# Patient Record
Sex: Female | Born: 1970 | Race: White | Hispanic: Yes | Marital: Single | Smoking: Never smoker
Health system: Southern US, Community
[De-identification: ages and names within clinical notes are randomized; demographics above are authoritative.]

---

## 1998-04-10 ENCOUNTER — Emergency Department (HOSPITAL_COMMUNITY): Admission: EM | Admit: 1998-04-10 | Discharge: 1998-04-10 | Payer: Self-pay | Admitting: Emergency Medicine

## 2005-07-15 ENCOUNTER — Ambulatory Visit: Payer: Self-pay | Admitting: Obstetrics and Gynecology

## 2006-04-23 ENCOUNTER — Emergency Department (HOSPITAL_COMMUNITY): Admission: EM | Admit: 2006-04-23 | Discharge: 2006-04-23 | Payer: Self-pay | Admitting: Emergency Medicine

## 2012-07-20 ENCOUNTER — Emergency Department (HOSPITAL_COMMUNITY): Payer: Self-pay

## 2012-07-20 ENCOUNTER — Emergency Department (HOSPITAL_COMMUNITY)
Admission: EM | Admit: 2012-07-20 | Discharge: 2012-07-21 | Disposition: A | Discharge status: Home / Self Care | Payer: Self-pay | Attending: Emergency Medicine | Admitting: Emergency Medicine

## 2012-07-20 ENCOUNTER — Encounter (HOSPITAL_COMMUNITY): Payer: Self-pay | Admitting: Emergency Medicine

## 2012-07-20 DIAGNOSIS — Z79899 Other long term (current) drug therapy: Secondary | ICD-10-CM | POA: Insufficient documentation

## 2012-07-20 DIAGNOSIS — M549 Dorsalgia, unspecified: Secondary | ICD-10-CM | POA: Insufficient documentation

## 2012-07-20 DIAGNOSIS — R071 Chest pain on breathing: Secondary | ICD-10-CM | POA: Insufficient documentation

## 2012-07-20 DIAGNOSIS — N63 Unspecified lump in unspecified breast: Secondary | ICD-10-CM | POA: Insufficient documentation

## 2012-07-20 DIAGNOSIS — R0789 Other chest pain: Secondary | ICD-10-CM

## 2012-07-20 DIAGNOSIS — Z3202 Encounter for pregnancy test, result negative: Secondary | ICD-10-CM | POA: Insufficient documentation

## 2012-07-20 LAB — CBC WITH DIFFERENTIAL/PLATELET
Eosinophils Relative: 1 % (ref 0–5)
HCT: 30 % — ABNORMAL LOW (ref 36.0–46.0)
Lymphocytes Relative: 33 % (ref 12–46)
Lymphs Abs: 3.2 10*3/uL (ref 0.7–4.0)
MCV: 72.3 fL — ABNORMAL LOW (ref 78.0–100.0)
Monocytes Relative: 6 % (ref 3–12)
Neutro Abs: 5.7 10*3/uL (ref 1.7–7.7)
Platelets: 399 10*3/uL (ref 150–400)
RBC: 4.15 MIL/uL (ref 3.87–5.11)
WBC: 9.6 10*3/uL (ref 4.0–10.5)

## 2012-07-20 LAB — COMPREHENSIVE METABOLIC PANEL
AST: 18 U/L (ref 0–37)
Alkaline Phosphatase: 83 U/L (ref 39–117)
CO2: 25 mEq/L (ref 19–32)
Chloride: 104 mEq/L (ref 96–112)
Creatinine, Ser: 0.63 mg/dL (ref 0.50–1.10)
GFR calc non Af Amer: >90 mL/min (ref >90)
Potassium: 4.2 mEq/L (ref 3.5–5.1)
Total Bilirubin: 0.4 mg/dL (ref 0.3–1.2)

## 2012-07-20 LAB — URINALYSIS, ROUTINE W REFLEX MICROSCOPIC
Bilirubin Urine: NEGATIVE
Glucose, UA: NEGATIVE mg/dL
Ketones, ur: NEGATIVE mg/dL
Protein, ur: NEGATIVE mg/dL
Urobilinogen, UA: 0.2 mg/dL (ref 0.0–1.0)

## 2012-07-20 LAB — URINE MICROSCOPIC-ADD ON

## 2012-07-20 NOTE — ED Notes (Signed)
Patient currently sitting up in bed; no respiratory or acute distress noted.  EDP at bedside explaining plan of care.  Patient denies any other needs at this time; will continue to monitor.

## 2012-07-20 NOTE — ED Notes (Signed)
DG Chest X-ray completed; resulted at 1654.

## 2012-07-20 NOTE — ED Notes (Signed)
Pt describes pain only with movement of right arm/right axilla in the right breast region and right upper back.  Pt. denies injury/trauma.  Pt. doesn't breast feed.  Pt states small area on right breast itches more than usual.

## 2012-07-20 NOTE — ED Notes (Signed)
EDP at bedside with interpreter phones. 

## 2012-07-20 NOTE — ED Provider Notes (Signed)
History     CSN: [id number]  Arrival date & time 07/20/12  1518   First MD Initiated Contact with Patient 07/20/12 2007      Chief Complaint  Patient presents with  . Breast Pain  . Back Pain   HPI: Ms. Arvizu is a 41 yo HF with no pertinent past medical history presents with chest pain and breast mass. She began to have right-sided chest pain several months ago. Located over right chest, described as aching, intermittent in nature, non-radiating, no alleviating or exacerbating factors. No associated symptoms. She has taken over the counter NSAID's without improvement of pain. Pain has been progressively worsening. Ten days ago she noted a lump in her breast which is not painful but there is mild pain surrounding the site. No overlying redness. She has no risk factors for PE, no risk factors for ACS. She has never had these symptoms before.   History reviewed. No pertinent past medical history.  History reviewed. No pertinent past surgical history.  History reviewed. No pertinent family history.  History  Substance Use Topics  . Smoking status: Never Smoker   . Smokeless tobacco: Not on file  . Alcohol Use: No    OB History    Grav Para Term Preterm Abortions TAB SAB Ect Mult Living                  Review of Systems  Constitutional: Negative for chills, fatigue and unexpected weight change.  HENT: Negative for neck pain and neck stiffness.   Eyes: Negative for photophobia and visual disturbance.  Respiratory: Negative for cough, shortness of breath and wheezing.   Cardiovascular: Positive for chest pain. Negative for palpitations.  Gastrointestinal: Negative for nausea, vomiting, abdominal pain and diarrhea.  Genitourinary: Negative for dysuria, urgency and decreased urine volume.  Musculoskeletal: Negative for myalgias, back pain and arthralgias.  Skin: Negative for pallor and rash.  Neurological: Negative for dizziness, weakness, numbness and headaches.    Psychiatric/Behavioral: Negative for confusion and agitation.    Allergies  Review of patient's allergies indicates no known allergies.  Home Medications   Current Outpatient Rx  Name  Route  Sig  Dispense  Refill  . IBUPROFEN 200 MG PO TABS   Oral   Take 400 mg by mouth every 6 (six) hours as needed. For pain.           BP 122/80  Pulse 80  Temp 98.2 F (36.8 C) (Oral)  Resp 18  SpO2 98%  LMP 07/10/2012  Physical Exam  Nursing note and vitals reviewed. Constitutional: She is oriented to person, place, and time. She appears well-developed and well-nourished. She is cooperative.  HENT:  Head: Normocephalic and atraumatic.  Mouth/Throat: Oropharynx is clear and moist and mucous membranes are normal.  Eyes: Conjunctivae normal and EOM are normal. Pupils are equal, round, and reactive to light.  Neck: Trachea normal and normal range of motion. No JVD present.  Cardiovascular: Normal rate, regular rhythm, S1 normal, S2 normal and normal heart sounds.  Exam reveals no decreased pulses.   Pulmonary/Chest: Effort normal and breath sounds normal. She has no decreased breath sounds. Right breast exhibits mass.    Abdominal: Soft. Normal appearance and bowel sounds are normal. There is no tenderness.  Musculoskeletal:       Thoracic back: Normal.  Neurological: She is alert and oriented to person, place, and time. She has normal reflexes.  Skin: Skin is dry.    ED Course  Procedures  Labs Reviewed  CBC WITH DIFFERENTIAL - Abnormal; Notable for the following:    Hemoglobin 9.0 (*)     HCT 30.0 (*)     MCV 72.3 (*)     MCH 21.7 (*)     All other components within normal limits  URINALYSIS, ROUTINE W REFLEX MICROSCOPIC - Abnormal; Notable for the following:    APPearance CLOUDY (*)     Hgb urine dipstick SMALL (*)     All other components within normal limits  URINE MICROSCOPIC-ADD ON - Abnormal; Notable for the following:    Squamous Epithelial / LPF MANY (*)      Bacteria, UA MANY (*)     All other components within normal limits  COMPREHENSIVE METABOLIC PANEL  D-DIMER, QUANTITATIVE  POCT PREGNANCY, URINE  URINE CULTURE   Dg Chest 2 View  07/20/2012  *RADIOLOGY REPORT*  Clinical Data: Right breast pain radiates up and around to the back for 2 months.  No known injury.  CHEST - 2 VIEW  Comparison: None.  Findings: Cardiomediastinal silhouette is within normal limits. The lungs are free of focal consolidations and pleural effusions. Bony structures have a normal appearance.  IMPRESSION: Negative exam.   Original Report Authenticated By: Norva Pavlov, M.D.    MDM  41 yo HF with no pertinent past medical history presents with chest pain and breast mass. Afebrile, vital signs stable. History not c/w with ACS as symptoms have been ongoing for several months, no associated symptoms, normal ECG, no risk factors. Considered PE but felt to be unlikely as she is low risk by Well's criteria and d-dimer was normal. CXR without evidence of mass, widened mediastinum, consolidation, effusion or PTX. No TTP of back or shoulder. Pain felt to likely be muscular in origin. Treated with oral percocet. Patient also concerned about breast mass. Likely cyst. No redness, induration, warmth of fluctuance to suggest infection. UA sent in triage before I evaluated patient, she denies any urinary symptoms. UA c/w contamination. Hgb 9, no rectal bleeding, dark stool or menorrhagia. MCV low so likely iron deficiency anemia, recent LMP. Normal lytes and cr. UPT negative. Will have patient f/u with the [facility] for further evaluation of breast mass. Return precautions given. Patient in agreement with plan.   ECG: SR, rate 61, normal axis, normal intervals, no significant abnormality. No previous for comparison.   Reviewed imaging, labs and previous medical records, utilized in MDM  Discussed case with Dr. Anitra Lauth  Clinical Impression 1. Chest wall pain 2. Breast mass 3.  Macrocytic anemia     Margie Billet, MD 07/21/12 [phone]

## 2012-07-20 NOTE — ED Notes (Signed)
Pt c/o right sided back pain and right breast pain x months; pt sts feels mass in breast that is not painful but is painful around area

## 2012-07-21 LAB — D-DIMER, QUANTITATIVE: D-Dimer, Quant: <0.27 ug/mL-FEU (ref 0.00–0.48)

## 2012-07-21 MED ORDER — OXYCODONE-ACETAMINOPHEN 5-325 MG PO TABS
1.0000 | ORAL_TABLET | Freq: Once | ORAL | Status: AC
  Administered 2012-07-21: 1 via ORAL
  Filled 2012-07-21: qty 1

## 2012-07-21 MED ORDER — OXYCODONE-ACETAMINOPHEN 5-325 MG PO TABS: 1.0000 | ORAL_TABLET | Freq: Four times a day (QID) | ORAL | Status: AC | PRN

## 2012-07-21 NOTE — ED Provider Notes (Signed)
I saw and evaluated the patient, reviewed the resident's note and I agree with the findings and plan. I have reviewed EKG and agree with the resident interpretation.  you Pt with right sided chest pain that is pleuritic but low risk well's and neg dimer.  Labs normal except for anemia of 9.  Pt started on iron.  Also referred to breast center for breast lump.  Gwyneth Sprout, MD 07/21/12 2207

## 2012-07-21 NOTE — ED Notes (Signed)
Patient given copy of discharge paperwork; went over discharge instructions with patient.  Patient instructed to take prescriptions as directed, to follow up with the breast center within one week, and to return to the ED for new, worsening, or concerning symptoms.  Interpreter phones used to translate.

## 2012-07-21 NOTE — ED Notes (Signed)
Patient currently sitting up in bed; no respiratory or acute distress noted.  Patient apologized to about delay in D-dimer result (machine to test for D-dimer was currently down/broken).  Results now back; EDP/resident aware and at bedside explaining disposition.  Patient denies any needs at this time.  Will continue to monitor.

## 2012-07-22 LAB — URINE CULTURE

## 2012-07-23 NOTE — ED Notes (Signed)
+   Urine Chart sent to EDP office for review. 

## 2012-07-28 ENCOUNTER — Telehealth (HOSPITAL_COMMUNITY): Payer: Self-pay | Admitting: Emergency Medicine

## 2012-07-28 NOTE — ED Notes (Signed)
Chart returned from EDP office. Prescribed Macrobid 100 mg PO BID x 5 days. Qty: #10 tabs. Prescribed by Encarnacion Slates PA-C.

## 2012-07-29 NOTE — ED Notes (Signed)
Unable to contact patient via phone. Sent letter. °

## 2012-08-20 NOTE — ED Notes (Signed)
Unable to contact patient via phone or letter. Chart sent to Medical Records. °

## 2020-03-17 ENCOUNTER — Emergency Department (HOSPITAL_COMMUNITY): Payer: Self-pay

## 2020-03-17 ENCOUNTER — Other Ambulatory Visit: Payer: Self-pay

## 2020-03-17 ENCOUNTER — Emergency Department (HOSPITAL_BASED_OUTPATIENT_CLINIC_OR_DEPARTMENT_OTHER): Payer: Self-pay

## 2020-03-17 ENCOUNTER — Encounter (HOSPITAL_COMMUNITY): Payer: Self-pay | Admitting: Emergency Medicine

## 2020-03-17 ENCOUNTER — Emergency Department (HOSPITAL_COMMUNITY)
Admission: EM | Admit: 2020-03-17 | Discharge: 2020-03-17 | Disposition: A | Discharge status: Home / Self Care | Payer: Self-pay | Attending: Emergency Medicine | Admitting: Emergency Medicine

## 2020-03-17 DIAGNOSIS — R0602 Shortness of breath: Secondary | ICD-10-CM | POA: Insufficient documentation

## 2020-03-17 DIAGNOSIS — R1084 Generalized abdominal pain: Secondary | ICD-10-CM | POA: Insufficient documentation

## 2020-03-17 DIAGNOSIS — R0789 Other chest pain: Secondary | ICD-10-CM | POA: Insufficient documentation

## 2020-03-17 DIAGNOSIS — Z20822 Contact with and (suspected) exposure to covid-19: Secondary | ICD-10-CM | POA: Insufficient documentation

## 2020-03-17 DIAGNOSIS — R5383 Other fatigue: Secondary | ICD-10-CM | POA: Insufficient documentation

## 2020-03-17 DIAGNOSIS — M7989 Other specified soft tissue disorders: Secondary | ICD-10-CM

## 2020-03-17 DIAGNOSIS — R519 Headache, unspecified: Secondary | ICD-10-CM | POA: Insufficient documentation

## 2020-03-17 DIAGNOSIS — R1030 Lower abdominal pain, unspecified: Secondary | ICD-10-CM

## 2020-03-17 DIAGNOSIS — M7918 Myalgia, other site: Secondary | ICD-10-CM | POA: Insufficient documentation

## 2020-03-17 DIAGNOSIS — F4321 Adjustment disorder with depressed mood: Secondary | ICD-10-CM

## 2020-03-17 LAB — COMPREHENSIVE METABOLIC PANEL
ALT: 42 U/L (ref 0–44)
AST: 28 U/L (ref 15–41)
Albumin: 3.8 g/dL (ref 3.5–5.0)
Alkaline Phosphatase: 87 U/L (ref 38–126)
Anion gap: 10 (ref 5–15)
BUN: 13 mg/dL (ref 6–20)
CO2: 23 mmol/L (ref 22–32)
Calcium: 9 mg/dL (ref 8.9–10.3)
Chloride: 105 mmol/L (ref 98–111)
Creatinine, Ser: 0.59 mg/dL (ref 0.44–1.00)
GFR calc Af Amer: >60 mL/min (ref >60)
GFR calc non Af Amer: >60 mL/min (ref >60)
Glucose, Bld: 104 mg/dL — ABNORMAL HIGH (ref 70–99)
Potassium: 4.2 mmol/L (ref 3.5–5.1)
Sodium: 138 mmol/L (ref 135–145)
Total Bilirubin: 0.7 mg/dL (ref 0.3–1.2)
Total Protein: 7.1 g/dL (ref 6.5–8.1)

## 2020-03-17 LAB — URINALYSIS, ROUTINE W REFLEX MICROSCOPIC
Bilirubin Urine: NEGATIVE
Glucose, UA: NEGATIVE mg/dL
Ketones, ur: NEGATIVE mg/dL
Leukocytes,Ua: NEGATIVE
Nitrite: NEGATIVE
Protein, ur: NEGATIVE mg/dL
Specific Gravity, Urine: 1.021 (ref 1.005–1.030)
pH: 5 (ref 5.0–8.0)

## 2020-03-17 LAB — D-DIMER, QUANTITATIVE (NOT AT ARMC): D-Dimer, Quant: <0.27 ug/mL-FEU (ref 0.00–0.50)

## 2020-03-17 LAB — LIPASE, BLOOD: Lipase: 28 U/L (ref 11–51)

## 2020-03-17 LAB — CBC
HCT: 36.9 % (ref 36.0–46.0)
Hemoglobin: 11.5 g/dL — ABNORMAL LOW (ref 12.0–15.0)
MCH: 24.7 pg — ABNORMAL LOW (ref 26.0–34.0)
MCHC: 31.2 g/dL (ref 30.0–36.0)
MCV: 79.4 fL — ABNORMAL LOW (ref 80.0–100.0)
Platelets: 397 10*3/uL (ref 150–400)
RBC: 4.65 MIL/uL (ref 3.87–5.11)
RDW: 16.4 % — ABNORMAL HIGH (ref 11.5–15.5)
WBC: 7.6 10*3/uL (ref 4.0–10.5)
nRBC: 0 % (ref 0.0–0.2)

## 2020-03-17 LAB — I-STAT BETA HCG BLOOD, ED (MC, WL, AP ONLY): I-stat hCG, quantitative: <5 m[IU]/mL (ref <5)

## 2020-03-17 LAB — TROPONIN I (HIGH SENSITIVITY): Troponin I (High Sensitivity): 7 ng/L (ref <18)

## 2020-03-17 LAB — POC SARS CORONAVIRUS 2 AG -  ED: SARS Coronavirus 2 Ag: NEGATIVE

## 2020-03-17 MED ORDER — IOHEXOL 300 MG/ML  SOLN
100.0000 mL | Freq: Once | INTRAMUSCULAR | Status: AC | PRN
  Administered 2020-03-17: 100 mL via INTRAVENOUS

## 2020-03-17 MED ORDER — DIPHENHYDRAMINE HCL 25 MG PO CAPS
50.0000 mg | ORAL_CAPSULE | Freq: Once | ORAL | Status: AC
  Administered 2020-03-17: 50 mg via ORAL
  Filled 2020-03-17: qty 2

## 2020-03-17 MED ORDER — SODIUM CHLORIDE 0.9 % IV BOLUS
1000.0000 mL | Freq: Once | INTRAVENOUS | Status: AC
  Administered 2020-03-17: 1000 mL via INTRAVENOUS

## 2020-03-17 MED ORDER — PROCHLORPERAZINE MALEATE 5 MG PO TABS
10.0000 mg | ORAL_TABLET | Freq: Once | ORAL | Status: AC
  Administered 2020-03-17: 10 mg via ORAL
  Filled 2020-03-17: qty 2

## 2020-03-17 MED ORDER — SODIUM CHLORIDE 0.9% FLUSH
3.0000 mL | Freq: Once | INTRAVENOUS | Status: AC
  Administered 2020-03-17: 3 mL via INTRAVENOUS

## 2020-03-17 NOTE — ED Triage Notes (Signed)
Patient arrives to ED with complaints of abdominal pain and weakness since March 16th. Per patient she was in [place] and had her uterus removed. Since that surgery patient has had mild abdominal pain that has gotten worse. Per patient, recently she has become so weak that she's having trouble walking, getting out of bed, and SOB. No vaginal bleeding reported.

## 2020-03-17 NOTE — ED Notes (Signed)
Pt returned from CT °

## 2020-03-17 NOTE — ED Notes (Signed)
Pt ambulated to the restroom, unsteady at first but pt walked to restroom and back without difficulty.

## 2020-03-17 NOTE — Discharge Instructions (Signed)
Your labwork and imaging were all very reassuring today I have attached a resource guide for counseling regarding your feeling of depression. You can also follow up with Behavioral Health for counseling.  Please follow up with your PCP. If you do not have one you can follow up with [facility] for primary care needs. Please also follow up with [facility] to establish care for OBGYN needs.  Return tot he ED IMMEDIATELY for any worsening symptoms. Return for any thoughts of self harm, feelings of wanting to harm others, or auditory/visual hallucinations.

## 2020-03-17 NOTE — Progress Notes (Signed)
Bilateral lower extremity venous duplex completed. Refer to "CV Proc" under chart review to view preliminary results.  03/17/2020 6:27 PM Eula Fried., MHA, RVT, RDCS, RDMS

## 2020-03-17 NOTE — ED Provider Notes (Signed)
[facility] EMERGENCY DEPARTMENT Provider Note   CSN: [id number] Arrival date & time: 03/17/20  0932     History Chief Complaint  Patient presents with  . Abdominal Pain  . Weakness    Shannon Bean is a 49 y.o. female who presents to the ED today with multiple complaints.   Pt reports that on 03/16 she had her uterus/ovaries removed with a "bush" placed on her bladder. She states that the next day she began having abdominal pain; she reports she was taken pain medication for approximately 1 month which helped however since being out she has had worsening pain in this area.   She states that [place] the [place] and had the second COVID vaccine shortly afterwards and has felt off since then.   She states that for the past 2 weeks she has had feelings of "heaviness" in her BLEs (L > R). She states that this is worse when she gets up in the mornings; she reports feeling very fatigued and like she cannot lift her legs however is able to. She does not believe it gets better when she ambulates. She also complains of shortness of breath and a "anxiety" in her chest - pt thinks this is related to being stressed about the heaviness in her legs. She also has been having frequent headaches for the past 2 weeks as well. Pt denies hx of headaches. No fevers, chills, nausea, vomiting, vision changes, neck stiffness, rash, confusion, unilateral weakness/numbness, speech changes. No recent sick contacts. She has been taking advil and ibuprofen without relief.   The history is provided by the patient and medical records. The history is limited by a language barrier. A language interpreter was used.       History reviewed. No pertinent past medical history.  There are no problems to display for this patient.   History reviewed. No pertinent surgical history.   OB History   No obstetric history on file.     History reviewed. No pertinent family  history.  Social History   Tobacco Use  . Smoking status: Never Smoker  Substance Use Topics  . Alcohol use: No  . Drug use: No    Home Medications Prior to Admission medications   Medication Sig Start Date End Date Taking? Authorizing Provider  ibuprofen (ADVIL,MOTRIN) 200 MG tablet Take 400 mg by mouth every 6 (six) hours as needed. For pain.   Yes [provider]  LISINOPRIL PO Take 1 Dose by mouth daily.   Yes [provider]  oxyCODONE-acetaminophen (PERCOCET/ROXICET) 5-325 MG per tablet Take 1 tablet by mouth every 6 (six) hours as needed for pain. Patient not taking: Reported on 03/17/2020 07/21/12   Margie Billet, MD    Allergies    Patient has no known allergies.  Review of Systems   Review of Systems  Constitutional: Positive for fatigue. Negative for chills and fever.  Eyes: Negative for visual disturbance.  Respiratory: Positive for shortness of breath.   Cardiovascular: Positive for chest pain.  Gastrointestinal: Positive for abdominal pain. Negative for constipation, diarrhea, nausea and vomiting.  Genitourinary: Negative for difficulty urinating, flank pain and frequency.  Musculoskeletal: Positive for myalgias. Negative for neck pain and neck stiffness.  Skin: Negative for rash.  Neurological: Positive for headaches.    Physical Exam Updated Vital Signs BP 129/74 (BP Location: Right Arm)   Pulse 67   Temp 98.4 F (36.9 C) (Oral)   Resp 16  LMP 07/10/2012 (LMP Unknown) Comment: uterus removed 02/2020  SpO2 99%   Physical Exam Vitals and nursing note reviewed.  Constitutional:      Appearance: She is not ill-appearing.  HENT:     Head: Normocephalic and atraumatic.  Eyes:     Conjunctiva/sclera: Conjunctivae normal.  Cardiovascular:     Rate and Rhythm: Normal rate and regular rhythm.     Heart sounds: Normal heart sounds.  Pulmonary:     Effort: Pulmonary effort is normal.     Breath sounds: Normal breath sounds. No wheezing,  rhonchi or rales.  Abdominal:     General: Bowel sounds are normal.     Palpations: Abdomen is soft.     Tenderness: There is generalized abdominal tenderness.     Comments: Well healed surgical scar noted to lower abdomen  Genitourinary:    Comments: Chaperone present for pelvic exam. Cuff intact. No drainage or erythema noted. No TTP with bimanual exam.  Musculoskeletal:     Cervical back: Neck supple.     Comments: No C, T, or L midline spinal TTP. No paralumbar spinal musculature TTP. Nonpitting edema bilaterally to LEs. 2+ DP pulses. ROM intact to BLEs. Sensation intact throughout. Strength 5/5.   Skin:    General: Skin is warm and dry.  Neurological:     Mental Status: She is alert.     Comments: CN 3-12 grossly intact A&O x4 GCS 15 Sensation and strength intact Gait nonataxic including with tandem walking Coordination with finger-to-nose WNL Neg romberg, neg pronator drift     ED Results / Procedures / Treatments   Labs (all labs ordered are listed, but only abnormal results are displayed) Labs Reviewed  COMPREHENSIVE METABOLIC PANEL - Abnormal; Notable for the following components:      Result Value   Glucose, Bld 104 (*)    All other components within normal limits  CBC - Abnormal; Notable for the following components:   Hemoglobin 11.5 (*)    MCV 79.4 (*)    MCH 24.7 (*)    RDW 16.4 (*)    All other components within normal limits  URINALYSIS, ROUTINE W REFLEX MICROSCOPIC - Abnormal; Notable for the following components:   APPearance HAZY (*)    Hgb urine dipstick SMALL (*)    Bacteria, UA RARE (*)    All other components within normal limits  LIPASE, BLOOD  D-DIMER, QUANTITATIVE (NOT AT [facility])  I-STAT BETA HCG BLOOD, ED ([facility], [facility], [facility] ONLY)  POC SARS CORONAVIRUS 2 AG -  ED  TROPONIN I (HIGH SENSITIVITY)    EKG EKG Interpretation  Date/Time:  Monday March 17 2020 17:06:09 EDT Ventricular Rate:  71 PR Interval:    QRS Duration: 109 QT  Interval:  409 QTC Calculation: 445 R Axis:   75 Text Interpretation: Sinus rhythm No old tracing to compare Confirmed by Jacalyn LefevreHaviland, Julie [phone]([id number]) on 03/17/2020 5:48:42 PM   Radiology CT Head Wo Contrast  Result Date: 03/17/2020 CLINICAL DATA:  49 year old female with headache. EXAM: CT HEAD WITHOUT CONTRAST TECHNIQUE: Contiguous axial images were obtained from the base of the skull through the vertex without intravenous contrast. COMPARISON:  None. FINDINGS: Brain: The ventricles and sulci appropriate size for patient's age. The gray-white matter discrimination is preserved. There is no acute intracranial hemorrhage. No mass effect midline shift. No extra-axial fluid collection. Vascular: No hyperdense vessel or unexpected calcification. Skull: Normal. Negative for fracture or focal lesion. Sinuses/Orbits: No acute finding. Other: None IMPRESSION: Unremarkable noncontrast CT of  the brain. Electronically Signed   By: Elgie Collard M.D.   On: 03/17/2020 18:36   CT Abdomen Pelvis W Contrast  Result Date: 03/17/2020 CLINICAL DATA:  49 year old female with abdominal pain. EXAM: CT ABDOMEN AND PELVIS WITH CONTRAST TECHNIQUE: Multidetector CT imaging of the abdomen and pelvis was performed using the standard protocol following bolus administration of intravenous contrast. CONTRAST:  OMNIPAQUE IOHEXOL 300 MG/ML  SOLN COMPARISON:  None. FINDINGS: Lower chest: The visualized lung bases are clear. No intra-abdominal free air or free fluid. Hepatobiliary: Fatty infiltration of the liver. No intrahepatic biliary ductal dilatation. The gallbladder is unremarkable. Pancreas: Unremarkable. No pancreatic ductal dilatation or surrounding inflammatory changes. Spleen: Normal in size without focal abnormality. Adrenals/Urinary Tract: The adrenal glands unremarkable. There is no hydronephrosis on either side. The visualized ureters and urinary bladder appear unremarkable. Stomach/Bowel: There is no bowel obstruction or  active inflammation. The appendix is normal. Vascular/Lymphatic: Mild aortoiliac atherosclerotic disease. The IVC is unremarkable. No portal venous gas. There is no adenopathy. Reproductive: Hysterectomy. No adnexal masses. Other: Anterior pelvic wall C-section scar. Musculoskeletal: No acute or significant osseous findings. IMPRESSION: 1. No acute intra-abdominal or pelvic pathology. No bowel obstruction. Normal appendix. 2. Fatty liver. 3. Aortic Atherosclerosis (ICD10-I70.0). Electronically Signed   By: Elgie Collard M.D.   On: 03/17/2020 18:44   DG Chest Port 1 View  Result Date: 03/17/2020 CLINICAL DATA:  Shortness of breath and nausea for 2 weeks EXAM: PORTABLE CHEST 1 VIEW COMPARISON:  None. FINDINGS: No consolidation, features of edema, pneumothorax, or effusion. Pulmonary vascularity is normally distributed. The cardiomediastinal contours are unremarkable. No acute osseous or soft tissue abnormality. Mineralization is adjacent the greater tuberosity of the left humerus which could reflect calcific tendinosis/hydroxyapatite deposition. IMPRESSION: 1. No acute cardiopulmonary disease. 2. 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 Electronically Signed   By: Kreg Shropshire M.D.   On: 03/17/2020 17:07   VAS [place] LOWER EXTREMITY VENOUS (DVT) (ONLY [facility] & [facility] 7a-7p)  Result Date: 03/17/2020  Lower Venous DVTStudy Indications: Swelling.  Risk Factors: Recent hysterectomy outside the country. Comparison Study: No prior study Performing Technologist: Gertie Fey MHA, RDMS, RVT, RDCS  Examination Guidelines: A complete evaluation includes B-mode imaging, spectral Doppler, color Doppler, and power Doppler as needed of all accessible portions of each vessel. Bilateral testing is considered an integral part of a complete examination. Limited examinations for reoccurring indications may be performed as noted. The reflux portion of the exam is performed with the patient in reverse  Trendelenburg.  +---------+---------------+---------+-----------+----------+--------------+ RIGHT    CompressibilityPhasicitySpontaneityPropertiesThrombus Aging +---------+---------------+---------+-----------+----------+--------------+ CFV      Full           Yes      Yes                                 +---------+---------------+---------+-----------+----------+--------------+ SFJ      Full                                                        +---------+---------------+---------+-----------+----------+--------------+ FV Prox  Full                                                        +---------+---------------+---------+-----------+----------+--------------+  FV Mid   Full                                                        +---------+---------------+---------+-----------+----------+--------------+ FV DistalFull                                                        +---------+---------------+---------+-----------+----------+--------------+ PFV      Full                                                        +---------+---------------+---------+-----------+----------+--------------+ POP      Full           Yes      Yes                                 +---------+---------------+---------+-----------+----------+--------------+ PTV      Full                                                        +---------+---------------+---------+-----------+----------+--------------+ PERO     Full                                                        +---------+---------------+---------+-----------+----------+--------------+   +---------+---------------+---------+-----------+----------+--------------+ LEFT     CompressibilityPhasicitySpontaneityPropertiesThrombus Aging +---------+---------------+---------+-----------+----------+--------------+ CFV      Full           Yes      Yes                                  +---------+---------------+---------+-----------+----------+--------------+ SFJ      Full                                                        +---------+---------------+---------+-----------+----------+--------------+ FV Prox  Full                                                        +---------+---------------+---------+-----------+----------+--------------+ FV Mid   Full                                                        +---------+---------------+---------+-----------+----------+--------------+  FV DistalFull                                                        +---------+---------------+---------+-----------+----------+--------------+ PFV      Full                                                        +---------+---------------+---------+-----------+----------+--------------+ POP      Full           Yes      Yes                                 +---------+---------------+---------+-----------+----------+--------------+ PTV      Full                                                        +---------+---------------+---------+-----------+----------+--------------+ PERO     Full                                                        +---------+---------------+---------+-----------+----------+--------------+     Summary: BILATERAL: - No evidence of deep vein thrombosis seen in the lower extremities, bilaterally. -No evidence of popliteal cyst, bilaterally.   *See table(s) above for measurements and observations.    Preliminary     Procedures Procedures (including critical care time)  Medications Ordered in ED Medications  sodium chloride flush (NS) 0.9 % injection 3 mL (3 mLs Intravenous Given 03/17/20 1658)  sodium chloride 0.9 % bolus 1,000 mL (1,000 mLs Intravenous New Bag/Given 03/17/20 1708)  prochlorperazine (COMPAZINE) tablet 10 mg (10 mg Oral Given 03/17/20 1708)  diphenhydrAMINE (BENADRYL) capsule 50 mg (50 mg Oral Given 03/17/20 1708)    iohexol (OMNIPAQUE) 300 MG/ML solution 100 mL (100 mLs Intravenous Contrast Given 03/17/20 1825)    ED Course  I have reviewed the triage vital signs and the nursing notes.  Pertinent labs & imaging results that were available during my care of the patient were reviewed by me and considered in my medical decision making (see chart for details).    MDM Rules/Calculators/A&P                          49 year old Spanish-speaking female presents to the ED with multiple complaints.  Reports that she has been having lower abdominal pain status post hysterectomy with bilateral oophorectomy on 03/16 in [place] and has been having abdominal pain since then.  Patient also complains of generalized fatigue for the past several months after receiving second Covid vaccine.  She began having a headache 2 weeks ago that has been intermittent since then as well as a heaviness in her bilateral lower extremities.  She also complains of chest pain and shortness of breath intermittently.  Arrival  to the ED patient is afebrile, nontachycardic and nontachypneic.  She appears to be in no acute distress.  She is able to speak in full sentences and is satting 99% on room air.  On exam she does have some diffuse lower abdominal tenderness palpation, no rebound or guarding.  She has no focal neuro deficits on exam.  No meningeal signs.  She has nonpitting edema bilaterally to her lower extremities, no obvious pain.  No pain palpated to neck or back.  Patient believes this is all related to having her uterus removed in March.   Lab work was obtained while patient was in the waiting room. CBC without leukocytosis.  Hemoglobin 11.5.  We have lab work from 9 years ago which is the most recent to compare to, hemoglobin 9.0 at that time. CMP without electrolyte abnormalities.  LFTs within normal limits. Lipase 28.  Beta hCG negative.  Pending UA.   Given complaint of abdominal pain since having surgical procedure done several  months ago we will plan on CT abdomen and pelvis however given complaint of shortness of breath with age and recent procedure will obtain D-dimer at this time to rule out PE.  If positive will order CT a as well as CT abdomen and pelvis.  Patient does currently have a headache without history of same.  Will provide headache cocktail and reassess.  She again has no focal neuro deficits on exam however given age may plan on CT head.  Have added on chest x-ray, EKG, troponin.  We will continue to monitor.   CXR clear D dimer negative Troponin 7; do not feel pt needs repeat given her sx have been ongoing for several weeks DVT study negative CT Head and CT A/P without acute findings. It does appear pt has ovaries although she thought she had them removed during surgery; her pain has been ongoing since march and I therefore would expect repercussions if her pain was related to torsion.   Pelvic exam performed; cuff intact without signs of infection. No TTP with bimanual exam.   While in room with patient she mentions she feels like she has been dealing with depressive symptoms as well with the recent death of her father prior to her hysterectomy. She is wondering if this could be the cause of her symptoms given negative workup. She denies SI, HI, or AVH. Certainly could be causing a factor in all of her constellation of symptoms today. Will provide outpatient resources for patient including counseling, PCP, and OBGYN. Pt instructed to return to the ED for any worsening sx.   This note was prepared using Dragon voice recognition software and may include unintentional dictation errors due to the inherent limitations of voice recognition software.  Final Clinical Impression(s) / ED Diagnoses Final diagnoses:  Lower abdominal pain  Fatigue, unspecified type  Shortness of breath  Bad headache  Grief reaction    Rx / DC Orders ED Discharge Orders    None       Discharge Instructions     Your  labwork and imaging were all very reassuring today I have attached a resource guide for counseling regarding your feeling of depression. You can also follow up with Behavioral Health for counseling.  Please follow up with your PCP. If you do not have one you can follow up with [facility] for primary care needs. Please also follow up with [facility] to establish care for OBGYN needs.  Return tot he ED  IMMEDIATELY for any worsening symptoms. Return for any thoughts of self harm, feelings of wanting to harm others, or auditory/visual hallucinations.        Tanda Rockers, PA-C 03/17/20 Margretta Ditty    Jacalyn Lefevre, MD 03/17/20 [phone]

## 2021-01-28 IMAGING — DX DG CHEST 1V PORT
1 series · 1 of 1 positions shown · non-contrast
Comparison: None.

CLINICAL DATA: Shortness of breath and nausea for 2 weeks

EXAM:
PORTABLE CHEST 1 VIEW

[chest]
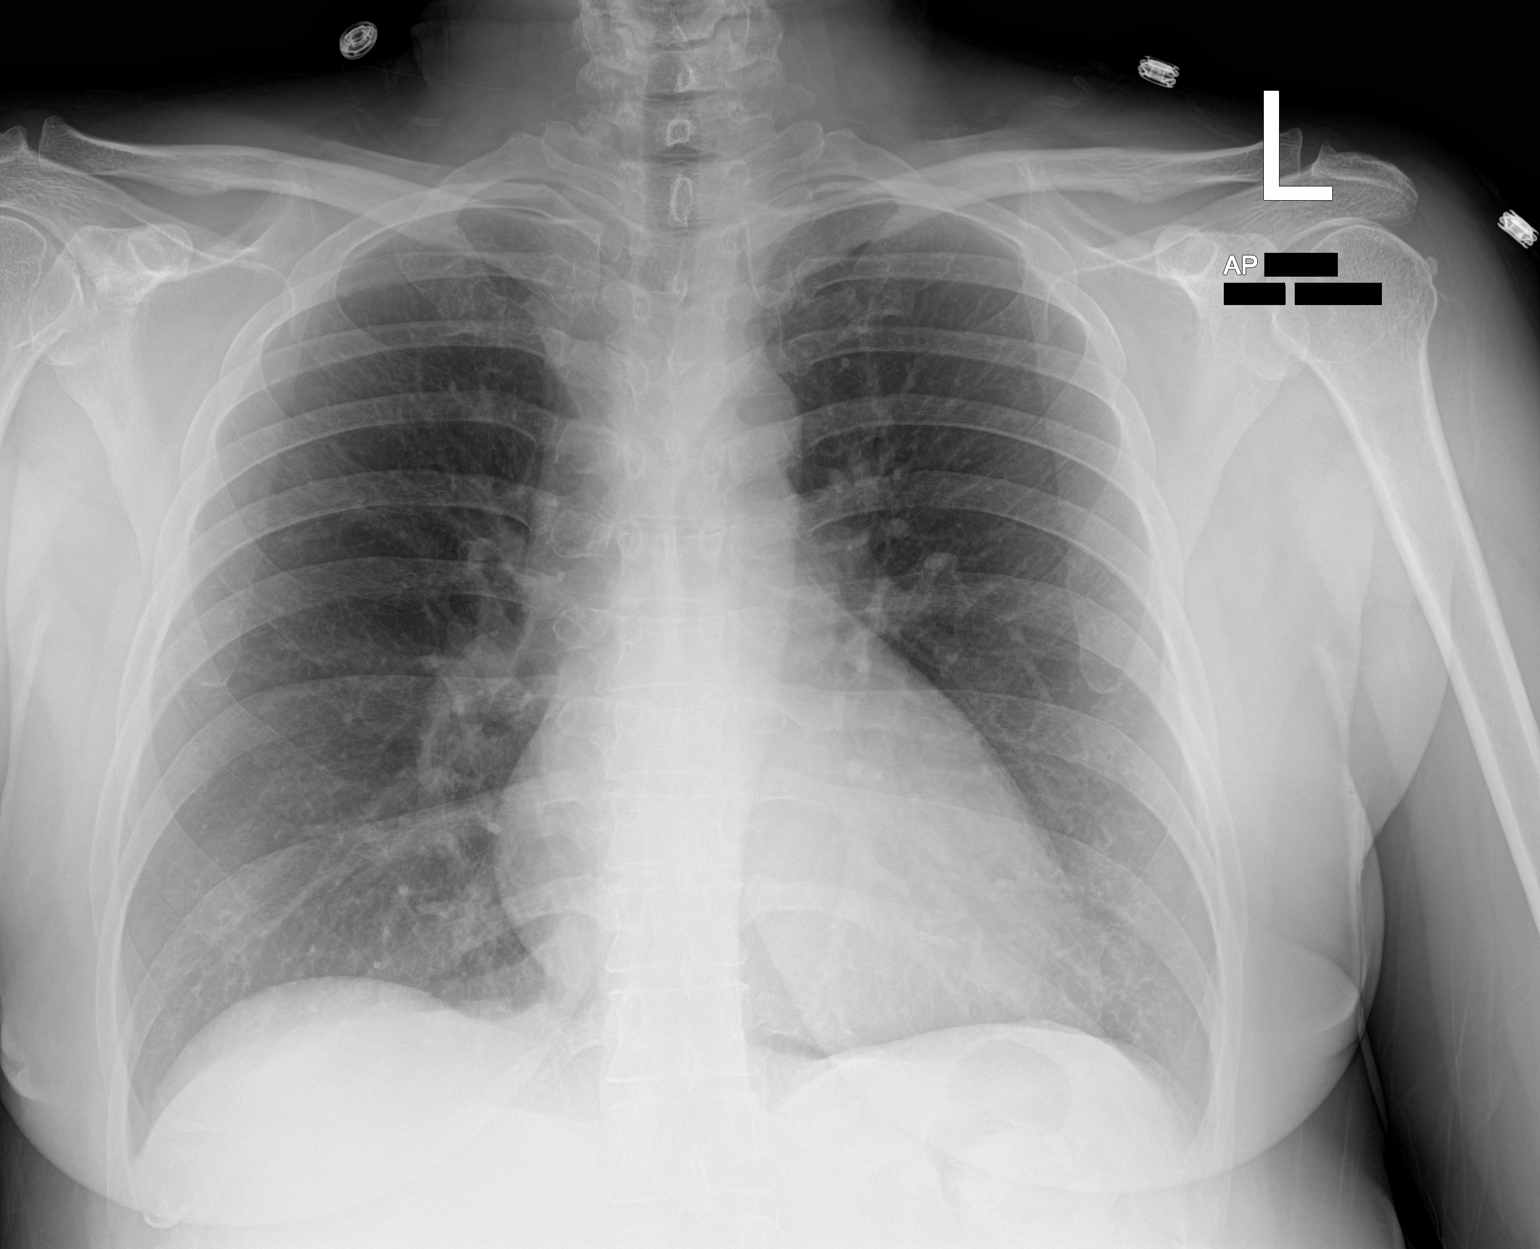

[1 of 1 positions shown; findings below may reference images not displayed]

FINDINGS: No consolidation, features of edema, pneumothorax, or effusion.
Pulmonary vascularity is normally distributed. The cardiomediastinal
contours are unremarkable. No acute osseous or soft tissue
abnormality. Mineralization is adjacent the greater tuberosity of
the left humerus which could reflect calcific
tendinosis/hydroxyapatite deposition.
IMPRESSION: 1. No acute cardiopulmonary disease.
2. Features suggesting CLEOPHAS REVERIEN/calcific tendinosis of the left
shoulder.

## 2021-01-28 IMAGING — CT CT HEAD W/O CM
4 series · 16 of 47 positions shown, 18 images · non-contrast
Comparison: None.

CLINICAL DATA: 49-year-old female with headache.

EXAM:
CT HEAD WITHOUT CONTRAST
TECHNIQUE: Contiguous axial images were obtained from the base of the skull
through the vertex without intravenous contrast.

[Series 3: head wo · axial · 0.44mm/px · z∈[-204,-84]mm · 7 of 34 slices shown, 9 images]
[im 5/34  brain]
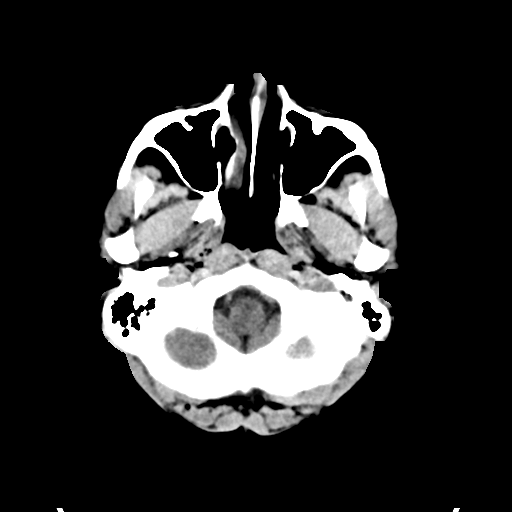
[im 5/34  bone]
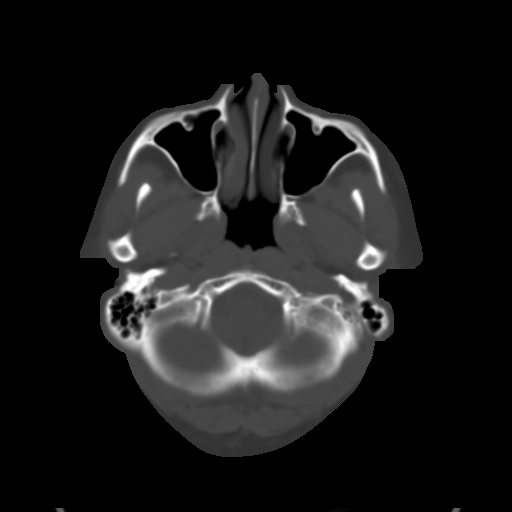
[im 9/34  brain]
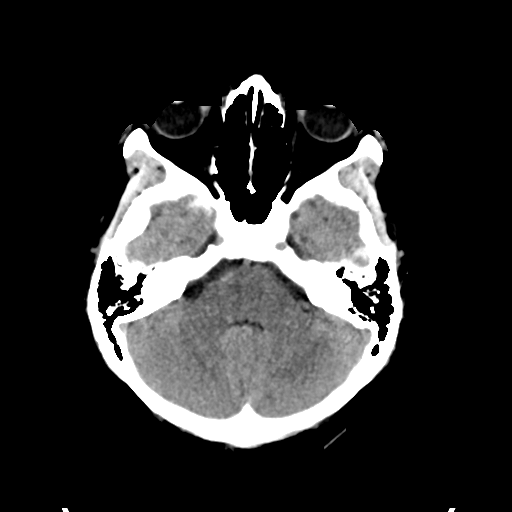
[im 13/34  brain]
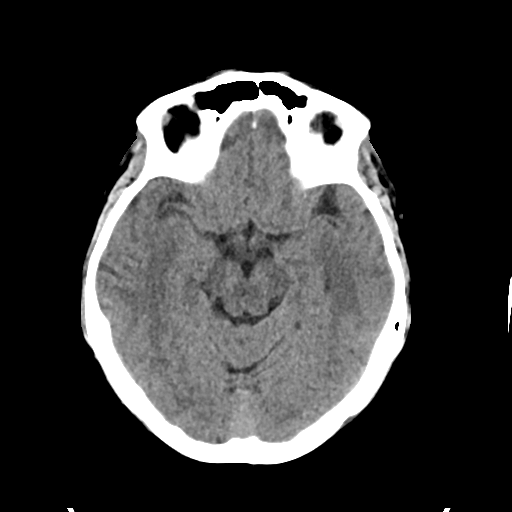
[im 17/34  brain]
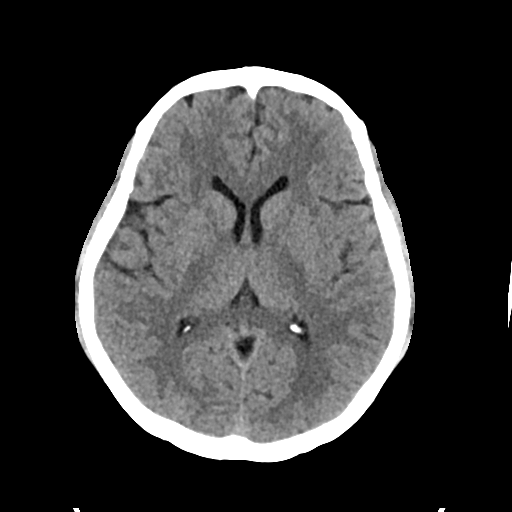
[im 21/34  brain]
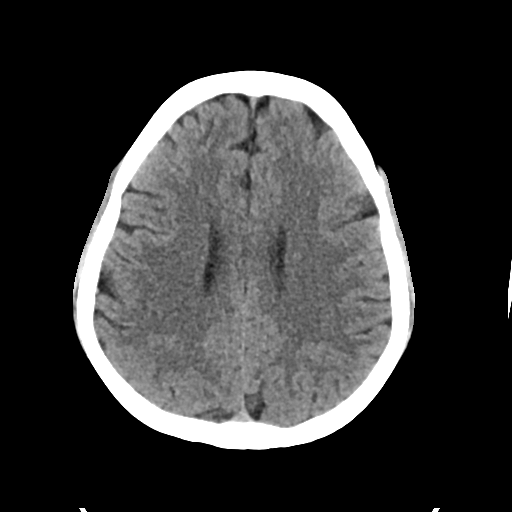
[im 21/34  bone]
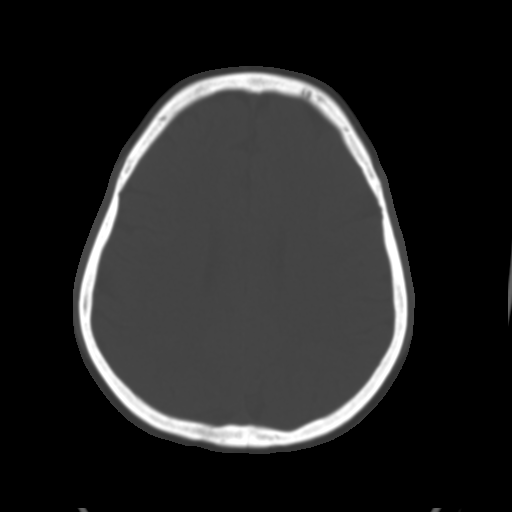
[im 25/34  brain]
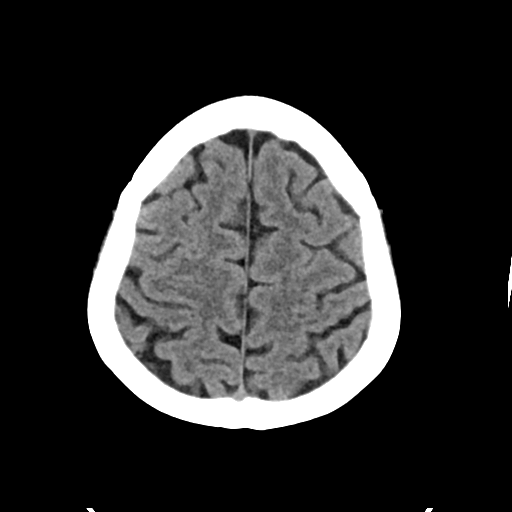
[im 29/34  brain]
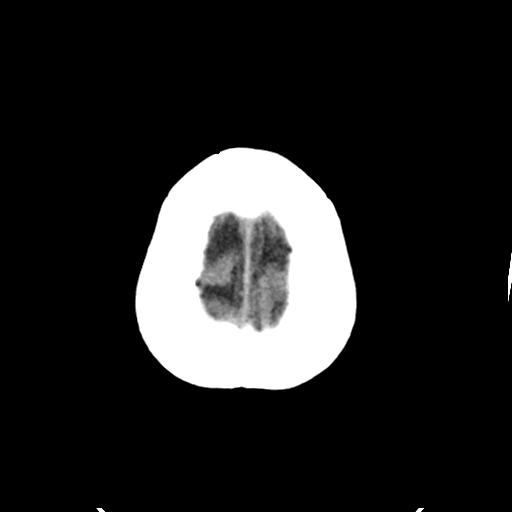

[Series 4: head bone · axial · 0.47mm/px · z∈[-208,-176]mm · 3 of 83 slices shown]
[im 9/83  bone]
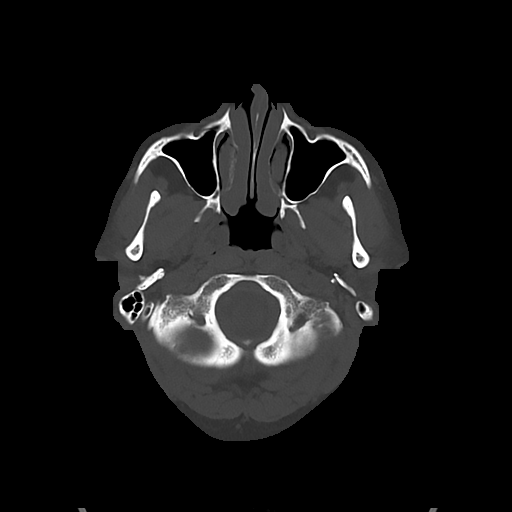
[im 17/83  bone]
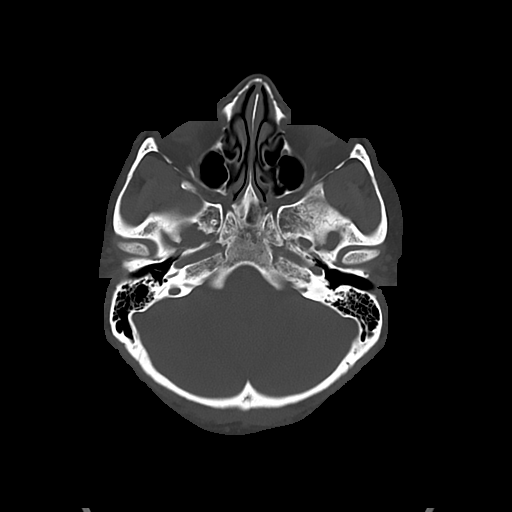
[im 25/83  bone]
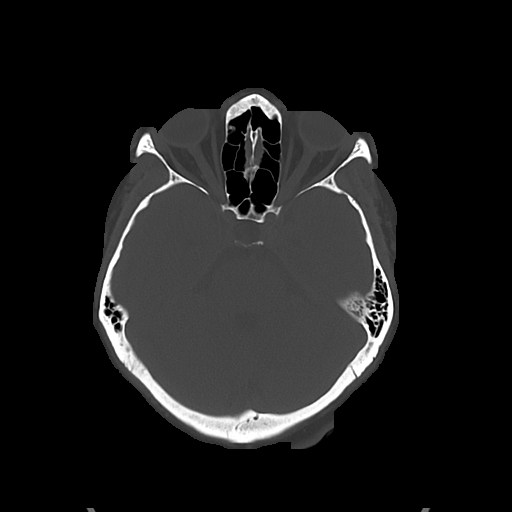

[Series 5: cor soft · coronal · 0.31mm/px · 3 of 64 slices shown]
[im 22/64  brain]
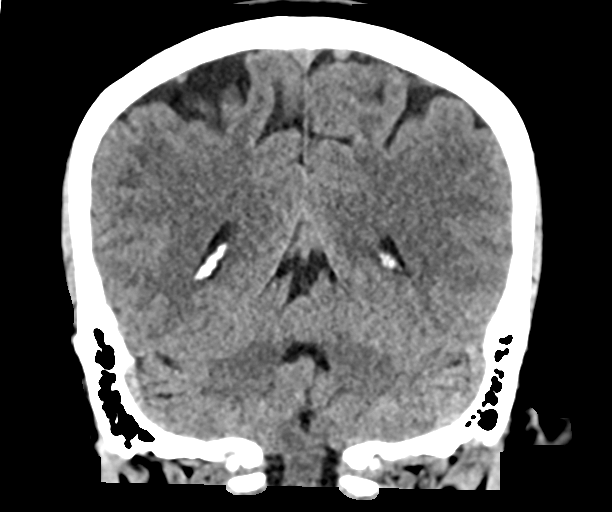
[im 29/64  brain]
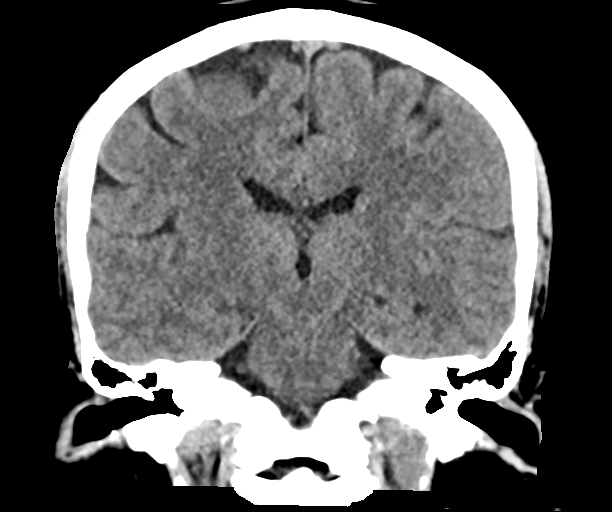
[im 36/64  brain]
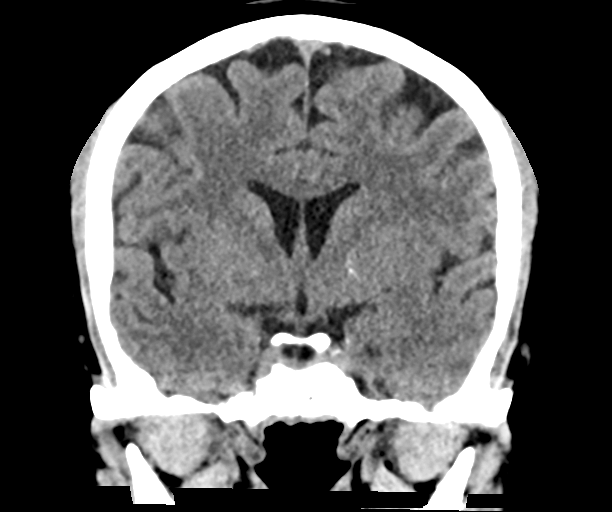

[Series 6: sag soft · sagittal · 0.31mm/px · 3 of 61 slices shown]
[im 21/61  brain]
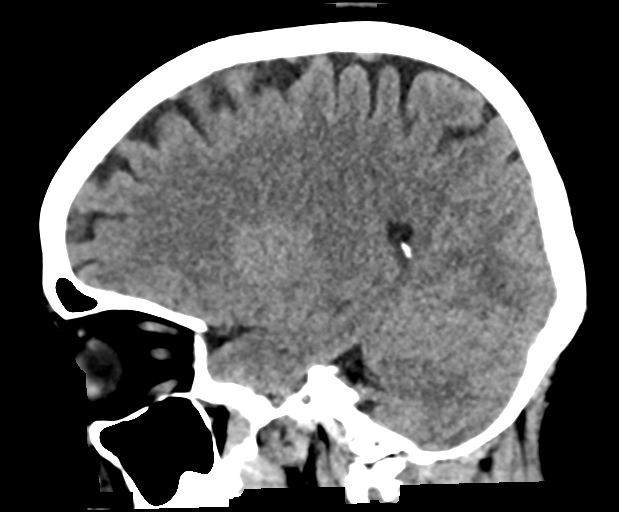
[im 31/61  brain]
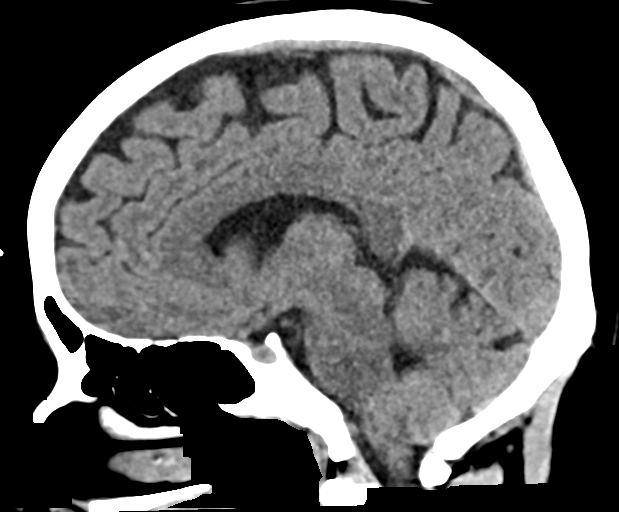
[im 41/61  brain]
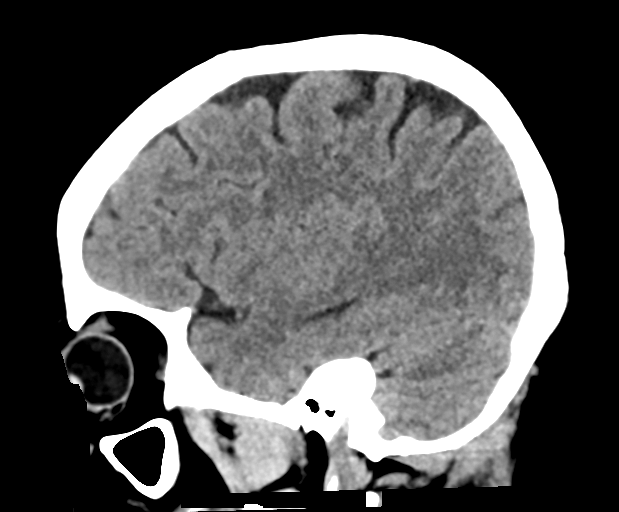

[16 of 47 positions shown; findings below may reference images not displayed]

FINDINGS: Brain: The ventricles and sulci appropriate size for patient's age.
The gray-white matter discrimination is preserved. There is no acute
intracranial hemorrhage. No mass effect midline shift. No
extra-axial fluid collection.

Vascular: No hyperdense vessel or unexpected calcification.

Skull: Normal. Negative for fracture or focal lesion.

Sinuses/Orbits: No acute finding.

Other: None
IMPRESSION: Unremarkable noncontrast CT of the brain.

## 2021-01-28 IMAGING — CT CT ABD-PELV W/ CM
2 of 5 series · 16 of 46 positions shown, 18 images · IV contrast (APPLIED)
Comparison: None.

CLINICAL DATA: 49-year-old female with abdominal pain.

EXAM:
CT ABDOMEN AND PELVIS WITH CONTRAST
TECHNIQUE: Multidetector CT imaging of the abdomen and pelvis was performed
using the standard protocol following bolus administration of
intravenous contrast.
CONTRAST:  100mL OMNIPAQUE IOHEXOL 300 MG/ML  SOLN

[Series 3: abdomen 5.0 · axial · 0.80mm/px · z∈[-884,-494]mm · 13 of 92 slices shown, 15 images]
[im 7/92  soft-tissue]
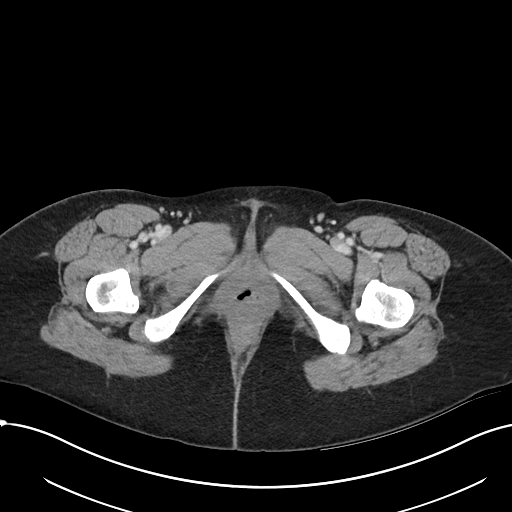
[im 7/92  bone]
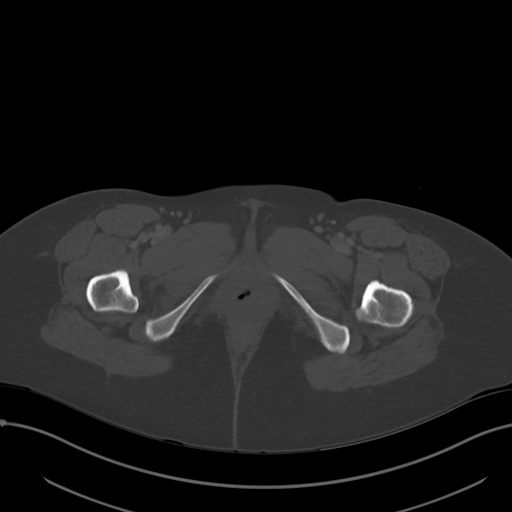
[im 14/92  soft-tissue]
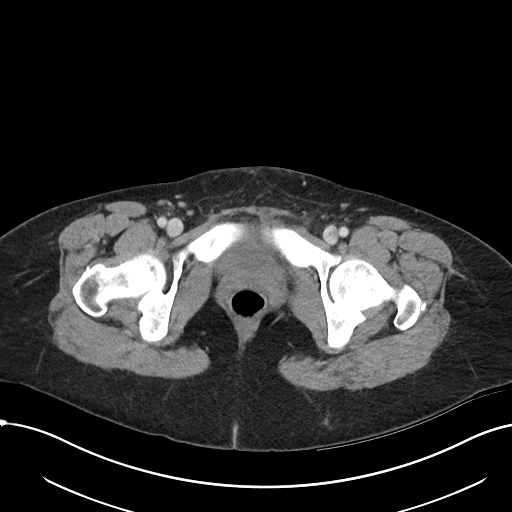
[im 20/92  soft-tissue]
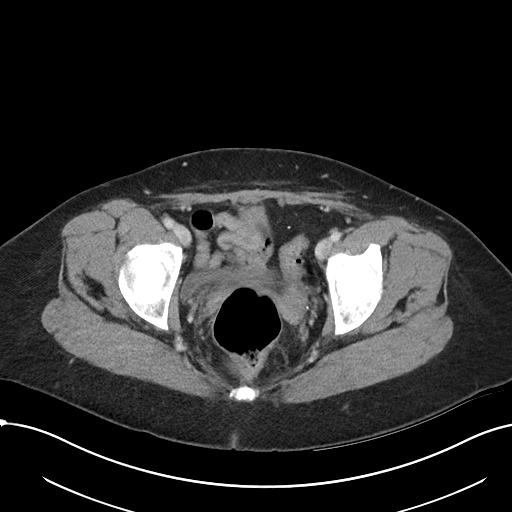
[im 27/92  soft-tissue]
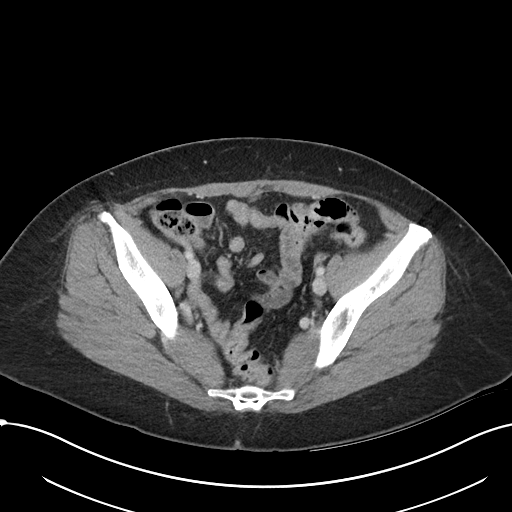
[im 33/92  soft-tissue]
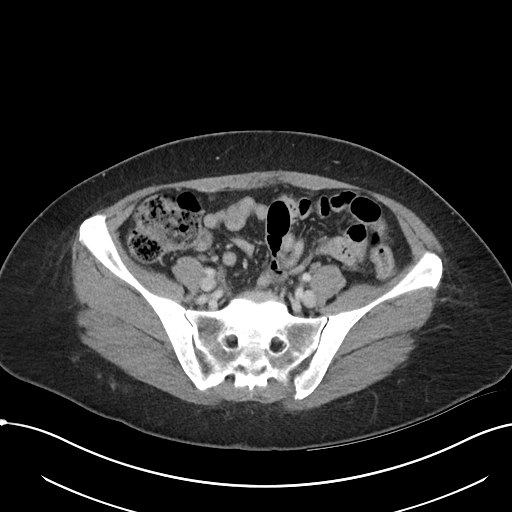
[im 40/92  soft-tissue]
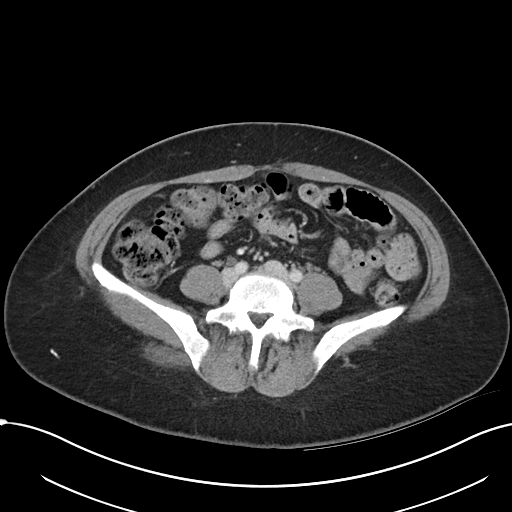
[im 46/92  soft-tissue]
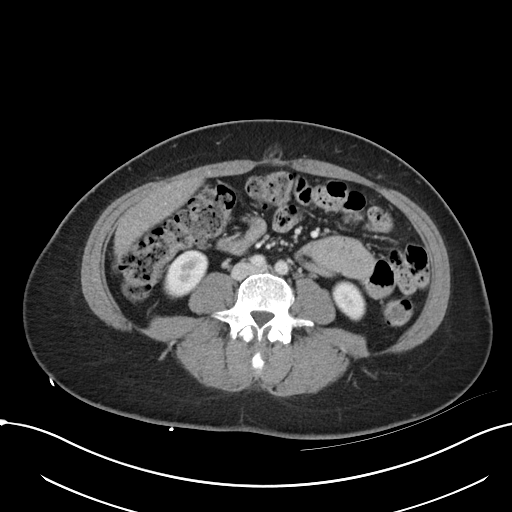
[im 53/92  soft-tissue]
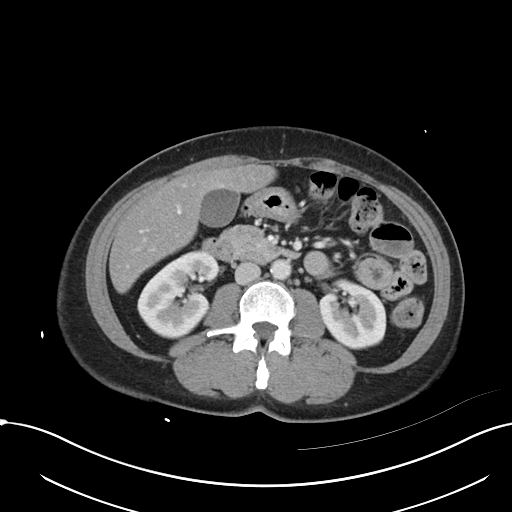
[im 59/92  soft-tissue]
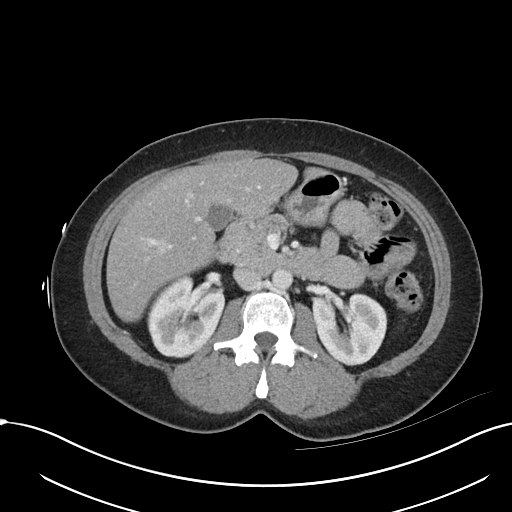
[im 59/92  bone]
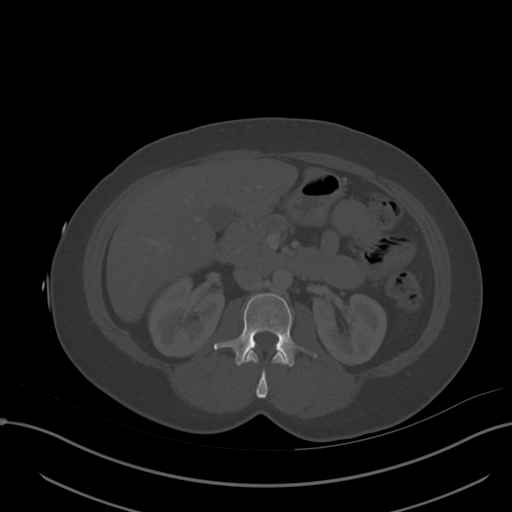
[im 66/92  soft-tissue]
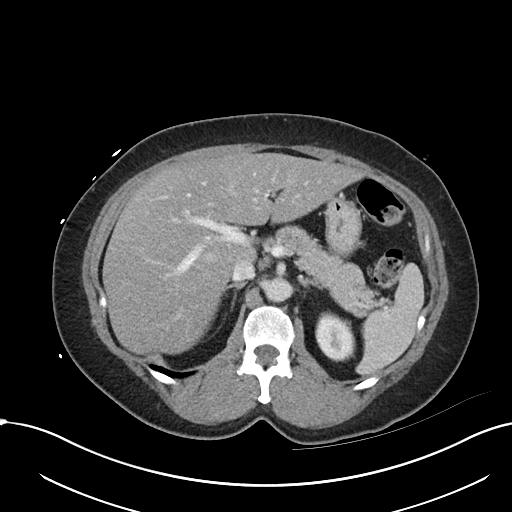
[im 72/92  soft-tissue]
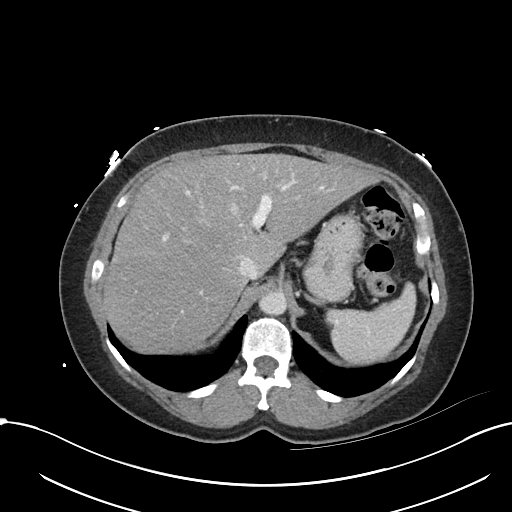
[im 79/92  soft-tissue]
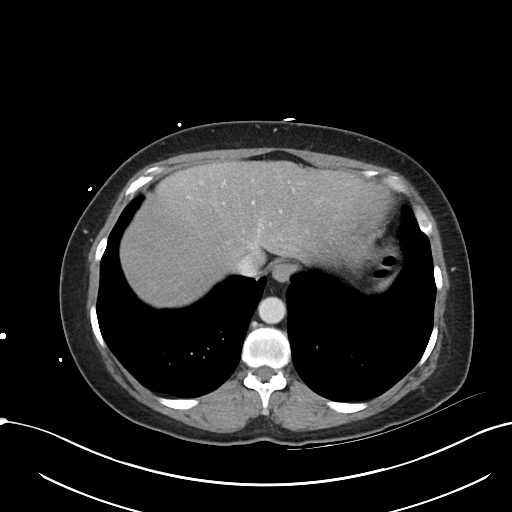
[im 85/92  soft-tissue]
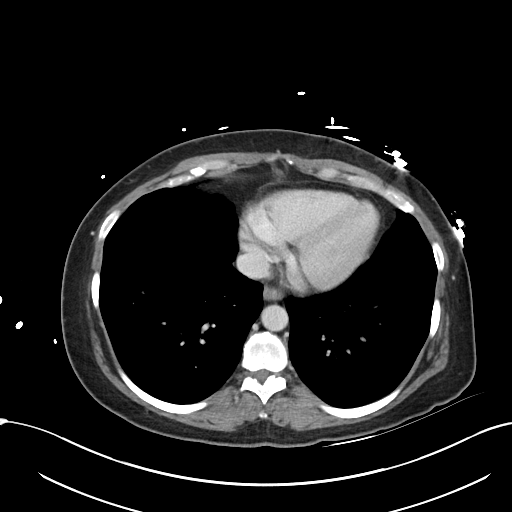

[Series 6: abdomen 3.0 mpr cor · coronal · 0.74mm/px · 3 of 92 slices shown]
[im 31/92  soft-tissue]
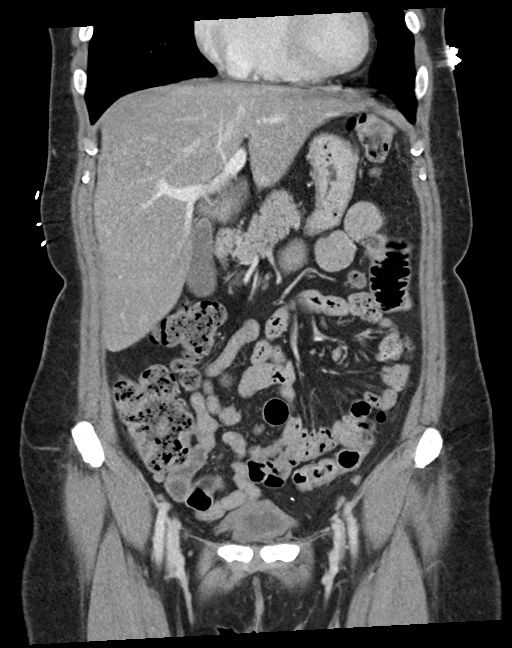
[im 41/92  soft-tissue]
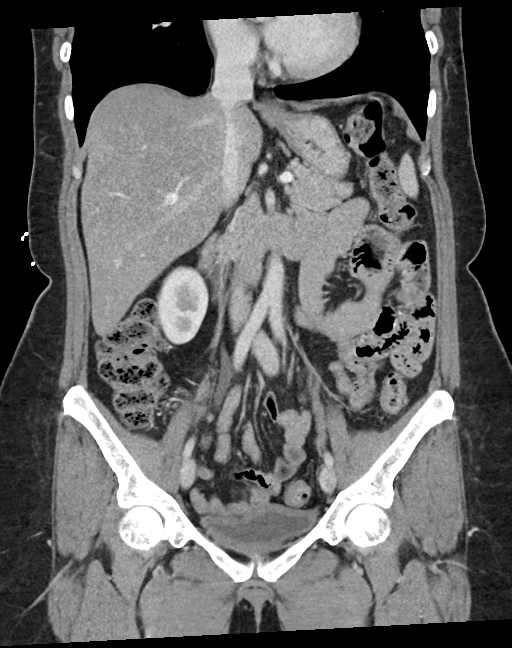
[im 51/92  soft-tissue]
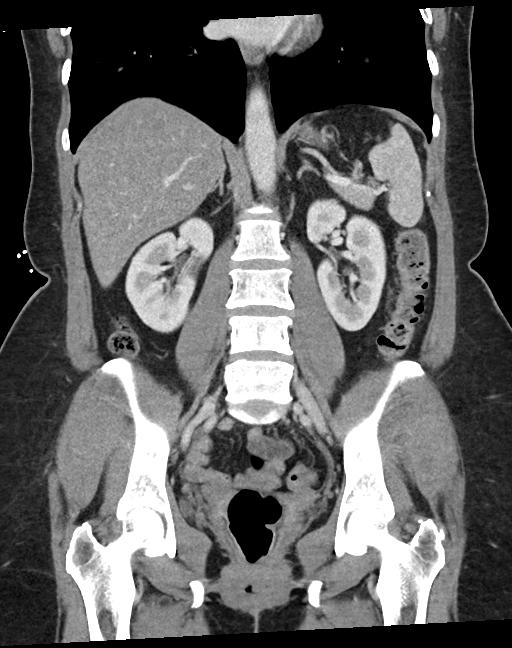

[16 of 46 positions shown; findings below may reference images not displayed]

FINDINGS: Lower chest: The visualized lung bases are clear.

No intra-abdominal free air or free fluid.

Hepatobiliary: Fatty infiltration of the liver. No intrahepatic
biliary ductal dilatation. The gallbladder is unremarkable.

Pancreas: Unremarkable. No pancreatic ductal dilatation or
surrounding inflammatory changes.

Spleen: Normal in size without focal abnormality.

Adrenals/Urinary Tract: The adrenal glands unremarkable. There is no
hydronephrosis on either side. The visualized ureters and urinary
bladder appear unremarkable.

Stomach/Bowel: There is no bowel obstruction or active inflammation.
The appendix is normal.

Vascular/Lymphatic: Mild aortoiliac atherosclerotic disease. The IVC
is unremarkable. No portal venous gas. There is no adenopathy.

Reproductive: Hysterectomy. No adnexal masses.

Other: Anterior pelvic wall C-section scar.

Musculoskeletal: No acute or significant osseous findings.
IMPRESSION: 1. No acute intra-abdominal or pelvic pathology. No bowel
obstruction. Normal appendix.
2. Fatty liver.
3. Aortic Atherosclerosis ([id number]).
